# Patient Record
Sex: Male | Born: 2000 | Race: White | Hispanic: No | Marital: Single | Smoking: Never smoker
Health system: Southern US, Community
[De-identification: ages and names within clinical notes are randomized; demographics above are authoritative.]

## PROBLEM LIST (undated history)

## (undated) DIAGNOSIS — F419 Anxiety disorder, unspecified: Secondary | ICD-10-CM

## (undated) DIAGNOSIS — F909 Attention-deficit hyperactivity disorder, unspecified type: Secondary | ICD-10-CM

## (undated) DIAGNOSIS — I370 Nonrheumatic pulmonary valve stenosis: Secondary | ICD-10-CM

## (undated) HISTORY — DX: Attention-deficit hyperactivity disorder, unspecified type: F90.9

## (undated) HISTORY — DX: Anxiety disorder, unspecified: F41.9

## (undated) HISTORY — DX: Nonrheumatic pulmonary valve stenosis: I37.0

## (undated) HISTORY — PX: TYMPANOSTOMY TUBE PLACEMENT: SHX32

---
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
## 2020-04-02 ENCOUNTER — Other Ambulatory Visit (INDEPENDENT_AMBULATORY_CARE_PROVIDER_SITE_OTHER): Payer: Self-pay | Admitting: Pediatric Gastroenterology

## 2020-04-10 ENCOUNTER — Encounter (INDEPENDENT_AMBULATORY_CARE_PROVIDER_SITE_OTHER): Payer: Self-pay | Admitting: Pediatric Gastroenterology

## 2020-04-10 ENCOUNTER — Telehealth (INDEPENDENT_AMBULATORY_CARE_PROVIDER_SITE_OTHER): Payer: BC Managed Care – PPO | Admitting: Pediatric Gastroenterology

## 2020-04-10 DIAGNOSIS — R1013 Epigastric pain: Secondary | ICD-10-CM

## 2020-04-10 DIAGNOSIS — R1084 Generalized abdominal pain: Secondary | ICD-10-CM

## 2020-04-10 MED ORDER — OMEPRAZOLE 20 MG PO CPDR: 20.0000 mg | DELAYED_RELEASE_CAPSULE | Freq: Every day | ORAL | 6 refills | Status: DC

## 2020-04-10 MED ORDER — DULOXETINE HCL 30 MG PO CPEP: ORAL_CAPSULE | ORAL | 2 refills | Status: DC

## 2020-04-10 NOTE — Patient Instructions (Signed)

## 2020-04-10 NOTE — Progress Notes (Signed)
This is a Pediatric Specialist E-Visit follow up consult provided via Epic video Andre Rose consented to an E-Visit consult today.  Location of patient: [name] is at his home in [place] (location) Location of provider: Harold Rose is at home office (location) Patient was referred by Orpha Bur, DO   The following participants were involved in this E-Visit: Almin and his mothwr (list of participants and their roles)  Chief Complain/ Reason for E-Visit today: Abdominal pain Total time on call: 14 minutes Follow up: 6 months       Pediatric Gastroenterology Follow Up Visit   REFERRING PROVIDER:  Orpha Bur, [place],   [id number]   ASSESSMENT:     I had the pleasure of seeing Andre Rose, 19 y.o. male (DOB: [birth year]) who I saw in follow up today for evaluation of chronic, intermittent postprandial vomiting and abdominal pain. His symptom complex is now dominated by abdominal pain. Of note, screening blood work after his first visit in October 2019 was normal.   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 He is doing well on a combination of duloxetine and omeprazole. He also takes Zoloft. I refilled his prescriptions today. He has had no symptoms that would suggest serotonin syndrome.  He had COVID in February 2021 but recovered fully.   He seems sensitive to antispasmodics, like Bentyl.      PLAN:       Duloxetine 30 mg daily and omeprazole 20 mg daily Return in  6 months   Thank you for allowing [place] to participate in the care of your patient      HISTORY OF PRESENT ILLNESS: Andre Rose is a 19 y.o. male (DOB: [birth year]) who is seen in follow-up for evaluation of chronic, intermittent postprandial vomiting. History was obtained from Andre Rose and his mother.  Since his last visit, he is feeling well.  He has good energy. He has good  appetite. He has regular bowel movements. He is not nauseated and does not vomit. He has no new symptoms and no appearance of "red flags". [place]  He has not experienced clinical side effects from the combination of duloxetine and Prozac. He tolerates omeprazole well.   He has screening blood work in October 2019, which was normal, including CBC, ESR, CRP, comprehensive metabolic panel and a screening for celiac disease.  He was seen at [facility] in 2016.  At that time his blood work was basically normal.  I do not have access to results of fecal calprotectin.  He had an adverse side effect with Bentyl, which "shut down his digestive system after 1 dose".  PAST MEDICAL HISTORY: Past Medical History:  Diagnosis Date  . ADHD   . Anxiety   . Pulmonary stenosis     There is no immunization history on file for this patient. PAST SURGICAL HISTORY: Past Surgical History:  Procedure Laterality Date  . TYMPANOSTOMY TUBE PLACEMENT     SOCIAL HISTORY: Social History   Socioeconomic History  . Marital status: Single    Spouse name: Not on file  . Number of children: Not on file  . Years of education: Not on file  . Highest education level: Not on file  Occupational History  . Not on file  Tobacco Use  . Smoking status: Never Smoker  . Smokeless tobacco: Never Used  Substance and Sexual Activity  . Alcohol use: Not on file  . Drug use: Not on file  . Sexual activity: Not on file  Other Topics Concern  . Not on file  Social History Narrative   [name] is currently 12 grade at [employer]. Lives at home with mother and visits with father. He has 4 dogs.   Social Determinants of Health   Financial Resource Strain:   . Difficulty of Paying Living Expenses:   Food Insecurity:   . Worried About Charity fundraiser in the Last Year:   . Arboriculturist in the Last Year:   Transportation Needs:   . Film/video editor (Medical):   Marland Kitchen  Lack of Transportation (Non-Medical):   Physical Activity:   . Days of Exercise per Week:   . Minutes of Exercise per Session:   Stress:   . Feeling of Stress :   Social Connections:   . Frequency of Communication with Friends and Family:   . Frequency of Social Gatherings with Friends and Family:   . Attends Religious Services:   . Active Member of Clubs or Organizations:   . Attends Archivist Meetings:   Marland Kitchen Marital Status:    FAMILY HISTORY: family history includes Colitis in his mother; Diabetes in his mother; GER disease in his mother; Hypertension in his father and mother; Thyroid disease in his father.   REVIEW OF SYSTEMS:  The balance of 12 systems reviewed is negative except as noted in the HPI.  MEDICATIONS: Current Outpatient Medications  Medication Sig Dispense Refill  . clindamycin-benzoyl peroxide (BENZACLIN) gel APPLY TOPICALLY ONCE DAILY AFTER BATHING  5  . DULoxetine (CYMBALTA) 30 MG capsule TAKE 1 CAPSULE BY MOUTH EVERY DAY 90 capsule 2  . omeprazole (PRILOSEC) 40 MG capsule TAKE 1 CAPSULE BY MOUTH EVERY DAY 90 capsule 4  . sertraline (ZOLOFT) 25 MG tablet TAKE 1 TABLET BY MOUTH EVERY DAY - TO BE TAKEN WITH A 50MG PILL TO MAKE 75MG DOSE 90 tablet 2  . sertraline (ZOLOFT) 50 MG tablet TAKE ONE TABLET BY MOUTH DAILY WITH ONE 25MG TABLET (TO MAKE 75MG DOSE) 90 tablet 0   No current facility-administered medications for this visit.   ALLERGIES: Atomoxetine  VITAL SIGNS: There were no vitals taken for this visit. PHYSICAL EXAM: Looked well on video  DIAGNOSTIC STUDIES:  I have reviewed all pertinent diagnostic studies, including: CBC    Component Value Date/Time   WBC 5.3 09/07/2018 0000   RBC 5.69 09/07/2018 0000   HGB 16.4 09/07/2018 0000   HCT 48.4 09/07/2018 0000   PLT 267 09/07/2018 0000   MCV 85.1 09/07/2018 0000   MCH 28.8 09/07/2018 0000   MCHC 33.9 09/07/2018 0000   RDW 13.0 09/07/2018 0000   LYMPHSABS 1,283 09/07/2018 0000   EOSABS 69  09/07/2018 0000   BASOSABS 69 09/07/2018 0000   CMP Latest Ref Rng & Units 09/07/2018  Glucose 65 - 99 mg/dL 89  BUN 7 - 20 mg/dL 14  Creatinine 0.60 - 1.20 mg/dL 0.83  Sodium 135 - 146 mmol/L 139  Potassium 3.8 - 5.1 mmol/L 4.6  Chloride 98 - 110 mmol/L 103  CO2 20 - 32 mmol/L 29  Calcium 8.9 - 10.4 mg/dL 9.8  Total Protein 6.3 - 8.2 g/dL 7.3  Total Bilirubin 0.2 - 1.1 mg/dL 0.5  AST 12 - 32 U/L 18  ALT 8 - 46 U/L 14     Francisco A. Yehuda Savannah, MD  Chief, Division of Pediatric Gastroenterology Professor of Pediatrics

## 2020-07-20 ENCOUNTER — Telehealth (INDEPENDENT_AMBULATORY_CARE_PROVIDER_SITE_OTHER): Payer: Self-pay | Admitting: Pediatric Gastroenterology

## 2020-07-20 ENCOUNTER — Other Ambulatory Visit (INDEPENDENT_AMBULATORY_CARE_PROVIDER_SITE_OTHER): Payer: Self-pay

## 2020-07-20 MED ORDER — SERTRALINE HCL 25 MG PO TABS: ORAL_TABLET | ORAL | 1 refills | Status: DC

## 2020-07-20 MED ORDER — SERTRALINE HCL 50 MG PO TABS: ORAL_TABLET | ORAL | 1 refills | Status: DC

## 2020-07-20 NOTE — Telephone Encounter (Signed)
Who's calling (name and relationship to patient) : Andre Rose   Best contact number: [phone]  Provider they see: Dr. Jacqlyn Krauss  Reason for call: Requesting a refill for sertraline 25 mg and 50 mg  Call ID:      PRESCRIPTION REFILL ONLY  Name of prescription:  Pharmacy:

## 2020-07-20 NOTE — Telephone Encounter (Signed)
Called and spoke to mom, DPR on file, and relayed to her that the medication has been sent into the pharmacy and that someone will be calling to schedule an appointment, as Dr. Jacqlyn Krauss would like to see him back around November.

## 2020-08-01 DIAGNOSIS — Z20822 Contact with and (suspected) exposure to covid-19: Secondary | ICD-10-CM | POA: Diagnosis not present

## 2020-10-23 ENCOUNTER — Telehealth (INDEPENDENT_AMBULATORY_CARE_PROVIDER_SITE_OTHER): Payer: BC Managed Care – PPO | Admitting: Pediatric Gastroenterology

## 2020-10-23 ENCOUNTER — Other Ambulatory Visit: Payer: Self-pay

## 2020-10-23 ENCOUNTER — Encounter (INDEPENDENT_AMBULATORY_CARE_PROVIDER_SITE_OTHER): Payer: Self-pay | Admitting: Pediatric Gastroenterology

## 2020-10-23 VITALS — Wt 161.3 lb

## 2020-10-23 DIAGNOSIS — R197 Diarrhea, unspecified: Secondary | ICD-10-CM

## 2020-10-23 DIAGNOSIS — R1084 Generalized abdominal pain: Secondary | ICD-10-CM

## 2020-10-23 DIAGNOSIS — R1013 Epigastric pain: Secondary | ICD-10-CM

## 2020-10-23 MED ORDER — DULOXETINE HCL 20 MG PO CPEP: 20.0000 mg | ORAL_CAPSULE | Freq: Every day | ORAL | 3 refills | Status: DC

## 2020-10-23 MED ORDER — VIBERZI 75 MG PO TABS: 75.0000 mg | ORAL_TABLET | Freq: Two times a day (BID) | ORAL | 3 refills | Status: AC

## 2020-10-23 NOTE — Progress Notes (Signed)
This is a Pediatric Specialist E-Visit follow up consult provided via Epic video GEOVANI TOOTLE consented to an E-Visit consult today.  Location of patient: Andre Rose is at his home in [place] (location) Location of provider: Harold Hedge is at home office (location) Patient was referred by Orpha Bur, DO   The following participants were involved in this E-Visit: Trae and his mothwr (list of participants and their roles)  Chief Complain/ Reason for E-Visit today: Abdominal pain Total time on call: 14 minutes Follow up: 6 months       Pediatric Gastroenterology Follow Up Visit   REFERRING PROVIDER:  Orpha Bur, [place],  [place] [id number]   ASSESSMENT:     I had the pleasure of seeing Andre Rose, 19 y.o. male (DOB: [birth year]) who I saw in follow up today for evaluation of chronic, intermittent postprandial vomiting and abdominal pain. His symptom complex is now dominated by abdominal pain. Of note, screening blood work after his first visit in October 2019 was normal.   His dominant symptom has changed again and is now diarrhea.  He has diarrhea intermittently.  Sometimes his stools are normal as well.  On the days that he has diarrhea, he passes 3-5 stools but he does not wake up at night to pass stool.  The symptoms have appeared despite the combination of Prozac and duloxetine.  I will reduce his dose of duloxetine to 20 mg daily and I suggest to start him on Viberzi 75 mg twice daily, taken with food.  I discussed both benefits and possible side effects of Viberzi, including severe pancreatitis.  I provided information about Viberzi in the after visit printout.  I also shared our contact information for concerns about lack of efficacy or side effects.  He had COVID in February 2021 but recovered fully.   He seems sensitive to antispasmodics, like Bentyl.      PLAN:       Duloxetine 20 mg daily and omeprazole 20 mg  daily Viberzi 75 mg twice daily I asked for an update by phone in 2 weeks Return in  3 months   Thank you for allowing [place] to participate in the care of your patient      HISTORY OF PRESENT ILLNESS: Andre Rose is a 19 y.o. male (DOB: 2000-12-08) who is seen in follow-up for evaluation of chronic, intermittent postprandial vomiting. History was obtained from Hyder and his mother.  Since his last visit, he started having diarrhea intermittently.  His stools are loose, 3 to 5/day.  He does not wake up at night to pass stool.  They happen about 3 to 4 days a week.  Otherwise, he has normal stools.  His stools do not contain blood.  His weight is up.  He has a good appetite.  He is not vomiting.  He also has persistent abdominal pain, especially before passing stool.  He has not experienced clinical side effects from the combination of duloxetine and Prozac. He tolerates omeprazole well.   He has screening blood work in October 2019, which was normal, including CBC, ESR, CRP, comprehensive metabolic panel and a screening for celiac disease.  He was seen at [facility] in 2016.  At that time his blood work was basically normal.  I do not have access to results of fecal calprotectin.  He had an adverse side effect with Bentyl, which "shut down his digestive system after 1 dose".  PAST MEDICAL  HISTORY: Past Medical History:  Diagnosis Date  . ADHD   . Anxiety   . COVID-19 12/2019  . Pulmonary stenosis     There is no immunization history on file for this patient. PAST SURGICAL HISTORY: Past Surgical History:  Procedure Laterality Date  . TYMPANOSTOMY TUBE PLACEMENT     SOCIAL HISTORY: Social History   Socioeconomic History  . Marital status: Single    Spouse name: Not on file  . Number of children: Not on file  . Years of education: Not on file  . Highest education level: Not on file  Occupational History  . Not on file  Tobacco Use  . Smoking status: Never Smoker  . Smokeless  tobacco: Never Used  Substance and Sexual Activity  . Alcohol use: Not on file  . Drug use: Not on file  . Sexual activity: Not on file  Other Topics Concern  . Not on file  Social History Narrative   Graduated [employer] [phone]. Starting college in 2021. Lives with dad in [place].    Social Determinants of Health   Financial Resource Strain:   . Difficulty of Paying Living Expenses: Not on file  Food Insecurity:   . Worried About Charity fundraiser in the Last Year: Not on file  . Ran Out of Food in the Last Year: Not on file  Transportation Needs:   . Lack of Transportation (Medical): Not on file  . Lack of Transportation (Non-Medical): Not on file  Physical Activity:   . Days of Exercise per Week: Not on file  . Minutes of Exercise per Session: Not on file  Stress:   . Feeling of Stress : Not on file  Social Connections:   . Frequency of Communication with Friends and Family: Not on file  . Frequency of Social Gatherings with Friends and Family: Not on file  . Attends Religious Services: Not on file  . Active Member of Clubs or Organizations: Not on file  . Attends Archivist Meetings: Not on file  . Marital Status: Not on file   FAMILY HISTORY: family history includes Colitis in his mother; Diabetes in his mother; GER disease in his mother; Hypertension in his father and mother; Thyroid disease in his father.   REVIEW OF SYSTEMS:  The balance of 12 systems reviewed is negative except as noted in the HPI.  MEDICATIONS: Current Outpatient Medications  Medication Sig Dispense Refill  . clindamycin-benzoyl peroxide (BENZACLIN) gel APPLY TOPICALLY ONCE DAILY AFTER BATHING  5  . DULoxetine (CYMBALTA) 30 MG capsule TAKE 1 CAPSULE BY MOUTH EVERY DAY 90 capsule 2  . omeprazole (PRILOSEC) 20 MG capsule Take 1 capsule (20 mg total) by mouth daily. 30 capsule 6  . sertraline (ZOLOFT) 25 MG tablet Take one tablet by mouth daily with one 50 mg tablet (to make 75 mg  dose). 90 tablet 1  . sertraline (ZOLOFT) 50 MG tablet TAKE ONE TABLET BY MOUTH DAILY WITH ONE 25MG TABLET (TO MAKE 75MG DOSE) 90 tablet 1   No current facility-administered medications for this visit.   ALLERGIES: Atomoxetine  VITAL SIGNS: There were no vitals taken for this visit. PHYSICAL EXAM: Looked well on video  DIAGNOSTIC STUDIES:  I have reviewed all pertinent diagnostic studies, including: CBC    Component Value Date/Time   WBC 5.3 09/07/2018 0000   RBC 5.69 09/07/2018 0000   HGB 16.4 09/07/2018 0000   HCT 48.4 09/07/2018 0000   PLT 267 09/07/2018 0000  MCV 85.1 09/07/2018 0000   MCH 28.8 09/07/2018 0000   MCHC 33.9 09/07/2018 0000   RDW 13.0 09/07/2018 0000   LYMPHSABS 1,283 09/07/2018 0000   EOSABS 69 09/07/2018 0000   BASOSABS 69 09/07/2018 0000   CMP Latest Ref Rng & Units 09/07/2018  Glucose 65 - 99 mg/dL 89  BUN 7 - 20 mg/dL 14  Creatinine 0.60 - 1.20 mg/dL 0.83  Sodium 135 - 146 mmol/L 139  Potassium 3.8 - 5.1 mmol/L 4.6  Chloride 98 - 110 mmol/L 103  CO2 20 - 32 mmol/L 29  Calcium 8.9 - 10.4 mg/dL 9.8  Total Protein 6.3 - 8.2 g/dL 7.3  Total Bilirubin 0.2 - 1.1 mg/dL 0.5  AST 12 - 32 U/L 18  ALT 8 - 46 U/L 14     Eulah Walkup A. Yehuda Savannah, MD Chief, Division of Pediatric Gastroenterology Professor of Pediatrics

## 2020-10-23 NOTE — Patient Instructions (Signed)
Contact information For emergencies after hours, on holidays or weekends: call [phone] and ask for the pediatric gastroenterologist on call.  For regular business hours: Pediatric GI phone number: Oletta Lamas) [place] [phone] OR Use MyChart to send messages  A special favor Our waiting list is over 2 months. Other children are waiting to be seen in our clinic. If you cannot make your next appointment, please contact us with at least 2 days notice to cancel and reschedule. Your timely phone call will allow another child to use the clinic slot.  Thank you!  Eluxadoline oral tablets What is this medicine? ELUXADOLINE (ee LUX ah dol ine) is an intestinal disorder drug. It is used to treat irritable bowel syndrome with diarrhea. This medicine may be used for other purposes; ask your health care provider or pharmacist if you have questions. COMMON BRAND NAME(S): Viberzi What should I tell my health care provider before I take this medicine? They need to know if you have any of these conditions:  drink more than 3 alcohol-containing drinks per day  gallbladder disease  have no gallbladder  history of constipation  history of drug abuse or alcohol abuse problem  history of pancreatitis or pancreatic disease  kidney disease  liver disease  stomach or intestine problems  an unusual or allergic reaction to eluxadoline, other medicines, foods, dyes, or preservatives  pregnant or trying to get pregnant  breast-feeding How should I use this medicine? Take this medicine by mouth with a glass of water. Follow the directions on the prescription label. Take this medicine with food. Take your medicine at regular intervals. Do not take it more often than directed. Do not stop taking except on your doctor's advice. A special MedGuide will be given to you by the pharmacist with each prescription and refill. Be sure to read this information carefully each time. Talk to your  pediatrician regarding the use of this medicine in children. Special care may be needed. Patients over 37 years of age may have a stronger reaction to this medicine. Overdosage: If you think you have taken too much of this medicine contact a poison control center or emergency room at once. NOTE: This medicine is only for you. Do not share this medicine with others. What if I miss a dose? If you miss a dose, skip it. Take your next dose at the normal time. Do not take extra or 2 doses at the same time to make up for the missed dose. What may interact with this medicine? This medicine may interact with the following medications:  alosetron  antihistamines for allergy, cough and cold  atropine  certain medicines for bladder problems like oxybutynin, tolterodine  certain medicines for HIV or hepatitis  certain medicines for stomach problems like dicyclomine, hyoscyamine  certain medicines for travel sickness like scopolamine  certain medicines for Parkinson's disease like benztropine, trihexyphenidyl  cyclosporine  eltrombopag  gemfibrozil  ipratropium  loperamide  narcotic medicines for pain  rifampin  rosuvastatin This list may not describe all possible interactions. Give your health care provider a list of all the medicines, herbs, non-prescription drugs, or dietary supplements you use. Also tell them if you smoke, drink alcohol, or use illegal drugs. Some items may interact with your medicine. What should I watch for while using this medicine? Visit your health care professional for regular checks on your progress. Tell your health care professional if your symptoms do not start to get better or if they get worse. This medicine may  cause constipation. If you do not have a bowel movement for 3 days, call your health care professional. Bonita Quin may get drowsy or dizzy. Do not drive, use machinery, or do anything that needs mental alertness until you know how this medicine affects  you. Do not stand or sit up quickly, especially if you are an older patient. This reduces the risk of dizzy or fainting spells. Alcohol may interfere with the effect of this medicine. Avoid alcoholic drinks. This medicine has a risk of abuse and dependence. Your health care provider will check you for this while you take this medicine. What side effects may I notice from receiving this medicine? Side effects that you should report to your doctor or health care professional as soon as possible:  allergic reactions like skin rash, itching or hives, swelling of the face, lips, or tongue  breathing problems  constipation  sudden or severe upper belly pain  vomiting Side effects that usually do not require medical attention (report to your doctor or health care professional if they continue or are bothersome):  dizziness  nausea  tiredness This list may not describe all possible side effects. Call your doctor for medical advice about side effects. You may report side effects to FDA at 1-800-FDA-1088. Where should I keep my medicine? Keep out of the reach of children. This medicine can be abused. Keep your medicine in a safe place to protect it from theft. Do not share this medicine with anyone. Selling or giving away this medicine is dangerous and against the law. Store at room temperature between 20 and 25 degrees C (68 and 77 degrees F). Throw away any unused medicine after the expiration date. NOTE: This sheet is a summary. It may not cover all possible information. If you have questions about this medicine, talk to your doctor, pharmacist, or health care provider.  2020 Elsevier/Gold Standard (2019-05-11 15:58:32)

## 2020-10-24 DIAGNOSIS — J0100 Acute maxillary sinusitis, unspecified: Secondary | ICD-10-CM | POA: Diagnosis not present

## 2020-10-24 DIAGNOSIS — J029 Acute pharyngitis, unspecified: Secondary | ICD-10-CM | POA: Diagnosis not present

## 2020-11-24 ENCOUNTER — Other Ambulatory Visit (INDEPENDENT_AMBULATORY_CARE_PROVIDER_SITE_OTHER): Payer: Self-pay

## 2020-11-24 ENCOUNTER — Telehealth (INDEPENDENT_AMBULATORY_CARE_PROVIDER_SITE_OTHER): Payer: Self-pay | Admitting: Pediatric Gastroenterology

## 2020-11-24 MED ORDER — SERTRALINE HCL 50 MG PO TABS: ORAL_TABLET | ORAL | 1 refills | Status: DC

## 2020-11-24 MED ORDER — SERTRALINE HCL 25 MG PO TABS: ORAL_TABLET | ORAL | 1 refills | Status: DC

## 2020-11-24 NOTE — Telephone Encounter (Signed)
Refills sent

## 2020-11-24 NOTE — Telephone Encounter (Signed)
Who's calling (name and relationship to patient) : Hinda Glatter mom  Best contact number: [phone]  Provider they see: Dr. Jacqlyn Krauss   Reason for call: Was not sent with other medications to the pharmacy  Call ID:      PRESCRIPTION REFILL ONLY  Name of prescription: Sertraline  Pharmacy: [facility] [address]

## 2020-12-18 DIAGNOSIS — Z20822 Contact with and (suspected) exposure to covid-19: Secondary | ICD-10-CM | POA: Diagnosis not present

## 2021-04-02 ENCOUNTER — Telehealth (INDEPENDENT_AMBULATORY_CARE_PROVIDER_SITE_OTHER): Payer: Self-pay | Admitting: Pediatric Gastroenterology

## 2021-04-02 DIAGNOSIS — R1084 Generalized abdominal pain: Secondary | ICD-10-CM

## 2021-04-02 DIAGNOSIS — R1013 Epigastric pain: Secondary | ICD-10-CM

## 2021-04-02 NOTE — Telephone Encounter (Signed)
Who's calling (name and relationship to patient) : Andre Rose flack mom   Best contact number: [phone]  Provider they see: Dr. Jacqlyn Krauss  Reason for call: Please call to inform when these have been sent to pharmacy.    Call ID:      PRESCRIPTION REFILL ONLY  Name of prescription: Sertraline 25 and 50 Duloxetine 20 prilosec  Pharmacy: [facility] [address]

## 2021-04-03 MED ORDER — SERTRALINE HCL 25 MG PO TABS: ORAL_TABLET | ORAL | 1 refills | Status: AC

## 2021-04-03 MED ORDER — SERTRALINE HCL 50 MG PO TABS: ORAL_TABLET | ORAL | 1 refills | Status: AC

## 2021-04-03 MED ORDER — OMEPRAZOLE 20 MG PO CPDR: 20.0000 mg | DELAYED_RELEASE_CAPSULE | Freq: Every day | ORAL | 1 refills | Status: AC

## 2021-04-03 MED ORDER — DULOXETINE HCL 20 MG PO CPEP: 20.0000 mg | ORAL_CAPSULE | Freq: Every day | ORAL | 3 refills | Status: AC

## 2021-04-03 NOTE — Telephone Encounter (Signed)
Refills sent, called mom to update, mom was thankful.  Mom mentioned he is feeling better on the 20 mg of cymbalta at this time.

## 2021-04-09 DIAGNOSIS — Z1389 Encounter for screening for other disorder: Secondary | ICD-10-CM | POA: Diagnosis not present

## 2021-04-09 DIAGNOSIS — Z0000 Encounter for general adult medical examination without abnormal findings: Secondary | ICD-10-CM | POA: Diagnosis not present

## 2021-04-09 DIAGNOSIS — Z1331 Encounter for screening for depression: Secondary | ICD-10-CM | POA: Diagnosis not present

## 2021-06-21 DIAGNOSIS — J029 Acute pharyngitis, unspecified: Secondary | ICD-10-CM | POA: Diagnosis not present

## 2021-06-21 DIAGNOSIS — R6889 Other general symptoms and signs: Secondary | ICD-10-CM | POA: Diagnosis not present

## 2021-06-21 DIAGNOSIS — R5383 Other fatigue: Secondary | ICD-10-CM | POA: Diagnosis not present

## 2021-07-16 ENCOUNTER — Ambulatory Visit (INDEPENDENT_AMBULATORY_CARE_PROVIDER_SITE_OTHER): Payer: BC Managed Care – PPO | Admitting: Pediatric Gastroenterology

## 2021-07-25 DIAGNOSIS — F411 Generalized anxiety disorder: Secondary | ICD-10-CM | POA: Diagnosis not present

## 2021-09-17 DIAGNOSIS — F411 Generalized anxiety disorder: Secondary | ICD-10-CM | POA: Diagnosis not present

## 2021-09-17 DIAGNOSIS — Z79891 Long term (current) use of opiate analgesic: Secondary | ICD-10-CM | POA: Diagnosis not present

## 2021-09-17 DIAGNOSIS — F410 Panic disorder [episodic paroxysmal anxiety] without agoraphobia: Secondary | ICD-10-CM | POA: Diagnosis not present

## 2021-10-18 DIAGNOSIS — F410 Panic disorder [episodic paroxysmal anxiety] without agoraphobia: Secondary | ICD-10-CM | POA: Diagnosis not present

## 2021-10-18 DIAGNOSIS — F411 Generalized anxiety disorder: Secondary | ICD-10-CM | POA: Diagnosis not present

## 2021-11-30 DIAGNOSIS — F411 Generalized anxiety disorder: Secondary | ICD-10-CM | POA: Diagnosis not present

## 2021-11-30 DIAGNOSIS — F322 Major depressive disorder, single episode, severe without psychotic features: Secondary | ICD-10-CM | POA: Diagnosis not present

## 2021-11-30 DIAGNOSIS — F410 Panic disorder [episodic paroxysmal anxiety] without agoraphobia: Secondary | ICD-10-CM | POA: Diagnosis not present

## 2022-01-29 DIAGNOSIS — F411 Generalized anxiety disorder: Secondary | ICD-10-CM | POA: Diagnosis not present

## 2022-01-29 DIAGNOSIS — F410 Panic disorder [episodic paroxysmal anxiety] without agoraphobia: Secondary | ICD-10-CM | POA: Diagnosis not present

## 2022-04-19 DIAGNOSIS — R519 Headache, unspecified: Secondary | ICD-10-CM | POA: Diagnosis not present

## 2022-04-19 DIAGNOSIS — Z1331 Encounter for screening for depression: Secondary | ICD-10-CM | POA: Diagnosis not present

## 2022-04-19 DIAGNOSIS — K219 Gastro-esophageal reflux disease without esophagitis: Secondary | ICD-10-CM | POA: Diagnosis not present

## 2022-09-17 DIAGNOSIS — F411 Generalized anxiety disorder: Secondary | ICD-10-CM | POA: Diagnosis not present

## 2023-03-03 ENCOUNTER — Ambulatory Visit (INDEPENDENT_AMBULATORY_CARE_PROVIDER_SITE_OTHER): Payer: BC Managed Care – PPO | Admitting: Podiatry

## 2023-03-03 DIAGNOSIS — L600 Ingrowing nail: Secondary | ICD-10-CM

## 2023-03-03 NOTE — Progress Notes (Signed)
  Subjective:  Patient ID: Andre Rose, male    DOB: 06-Jan-2001,  MRN: [id number]  Chief Complaint  Patient presents with   Ingrown Toenail    Ingrowns on bilateral great hallux, left over right in pain, prior treatments include episome salt and alcohol baths, neosporin etc and nothing helped     22 y.o. male presents with concern for pain caused by ingrown nails in the bilateral hallux nail.  He says the inside border of both great toes is causing him pain left is much worse than right though.  He has never had them removed in the past.  Has some redness and swelling but denies much drainage  Past Medical History:  Diagnosis Date   ADHD    Anxiety    COVID-19 12/2019   Pulmonary stenosis     Allergies  Allergen Reactions   Atomoxetine Anaphylaxis    ROS: Negative except as per HPI above  Objective:  General: AAO x3, NAD  Dermatological: Incurvation is present along the medial nail border of the bilateral toe. There is localized edema without any erythema or increase in warmth around the nail border. There is no drainage or pus. There is no ascending cellulitis. No malodor. No open lesions or pre-ulcerative lesions.    Vascular:  Dorsalis Pedis artery and Posterior Tibial artery pedal pulses are 2/4 bilateral.  Capillary fill time < 3 sec to all digits.   Neruologic: Grossly intact via light touch bilateral. Protective threshold intact to all sites bilateral.   Musculoskeletal: No gross boney pedal deformities bilateral. No pain, crepitus, or limitation noted with foot and ankle range of motion bilateral. Muscular strength 5/5 in all groups tested bilateral.  Gait: Unassisted, Nonantalgic.   No images are attached to the encounter.  Assessment:   1. Ingrown nail of great toe of left foot   2. Ingrown nail of great toe of right foot      Plan:  Patient was evaluated and treated and all questions answered.  #Ingrown Nail, left - Pt wants to proceed with removal of  left hallux nail today. Wants to hold off on right until later date.  -Patient elects to proceed with minor surgery to remove ingrown toenail today. Consent reviewed and signed by patient. -Ingrown nail excised. See procedure note. -Educated on post-procedure care including soaking. Written instructions provided and reviewed. -Patient to follow up in 2 weeks for nail check.  Procedure: Excision of Ingrown Toenail Location: Left 1st toe medial nail borders. Anesthesia: Lidocaine 1% plain; 1.5 mL and Marcaine 0.5% plain; 1.5 mL, digital block. Skin Prep: Betadine. Dressing: Silvadene; telfa; dry, sterile, compression dressing. Technique: Following skin prep, the toe was exsanguinated and a tourniquet was secured at the base of the toe. The affected nail border was freed, split with a nail splitter, and excised. Chemical matrixectomy was then performed with phenol and irrigated out with alcohol. The tourniquet was then removed and sterile dressing applied. Disposition: Patient tolerated procedure well. Patient to return in 2 weeks for follow-up.    Return in about 2 weeks (around 03/17/2023) for Follow-up left hallux nail, proc right hallux nail.          Corinna Gab, DPM [facility] / [facility]

## 2023-03-03 NOTE — Patient Instructions (Signed)

## 2023-03-17 ENCOUNTER — Ambulatory Visit (INDEPENDENT_AMBULATORY_CARE_PROVIDER_SITE_OTHER): Payer: BC Managed Care – PPO | Admitting: Podiatry

## 2023-03-17 DIAGNOSIS — Z91199 Patient's noncompliance with other medical treatment and regimen due to unspecified reason: Secondary | ICD-10-CM

## 2023-03-17 NOTE — Progress Notes (Signed)
Pt was a no show for apt, CG 

## 2023-03-21 ENCOUNTER — Ambulatory Visit (INDEPENDENT_AMBULATORY_CARE_PROVIDER_SITE_OTHER): Payer: No Typology Code available for payment source | Admitting: Podiatry

## 2023-03-21 DIAGNOSIS — L600 Ingrowing nail: Secondary | ICD-10-CM

## 2023-03-21 NOTE — Patient Instructions (Signed)

## 2023-03-21 NOTE — Progress Notes (Signed)
  Subjective:  Patient ID: Andre Rose, male    DOB: December 10, 2000,  MRN: [id number]  Chief Complaint  Patient presents with   Ingrown Toenail    Right foot ingrown nail infection    22 y.o. male presents with concern for pain in the right big toe due to possible ingrown.  He has redness and swelling of both borders of the right hallux nail.  Has previously had a left hallux medial border removed and is doing well no issues there.  Past Medical History:  Diagnosis Date   ADHD    Anxiety    COVID-19 12/2019   Pulmonary stenosis     Allergies  Allergen Reactions   Atomoxetine Anaphylaxis    ROS: Negative except as per HPI above  Objective:  General: AAO x3, NAD  Dermatological: Incurvation is present along the bilateral nail border of the right great toe. There is localized edema without any erythema or increase in warmth around the nail border. There is no drainage or pus. There is no ascending cellulitis. No malodor. No open lesions or pre-ulcerative lesions.    Vascular:  Dorsalis Pedis artery and Posterior Tibial artery pedal pulses are 2/4 bilateral.  Capillary fill time < 3 sec to all digits.   Neruologic: Grossly intact via light touch bilateral. Protective threshold intact to all sites bilateral.   Musculoskeletal: No gross boney pedal deformities bilateral. No pain, crepitus, or limitation noted with foot and ankle range of motion bilateral. Muscular strength 5/5 in all groups tested bilateral.  Gait: Unassisted, Nonantalgic.   No images are attached to the encounter.   Assessment:   1. Ingrown nail of great toe of right foot      Plan:  Patient was evaluated and treated and all questions answered.  Ingrown Nail, right -Patient elects to proceed with minor surgery to remove ingrown toenail today. Consent reviewed and signed by patient. -Ingrown nail excised. See procedure note. -Educated on post-procedure care including soaking. Written instructions  provided and reviewed. -Patient to follow up in 2 weeks for nail check.  Procedure: Excision of Ingrown Toenail Location: Right 1st toe  bilateral  nail borders. Anesthesia: Lidocaine 1% plain; 1.5 mL and Marcaine 0.5% plain; 1.5 mL, digital block. Skin Prep: Betadine. Dressing: Silvadene; telfa; dry, sterile, compression dressing. Technique: Following skin prep, the toe was exsanguinated and a tourniquet was secured at the base of the toe. The affected nail border was freed, split with a nail splitter, and excised. Chemical matrixectomy was then performed with phenol and irrigated out with alcohol. The tourniquet was then removed and sterile dressing applied. Disposition: Patient tolerated procedure well. Patient to return in 2 weeks for follow-up.    Return in about 2 weeks (around 04/04/2023), or if symptoms worsen or fail to improve, for nail check.          Corinna Gab, DPM [facility]
